# Patient Record
Sex: Female | Born: 1935
Health system: Southern US, Community
[De-identification: ages and names within clinical notes are randomized; demographics above are authoritative.]

## PROBLEM LIST (undated history)

## (undated) DIAGNOSIS — M1990 Unspecified osteoarthritis, unspecified site: Secondary | ICD-10-CM

## (undated) DIAGNOSIS — I10 Essential (primary) hypertension: Secondary | ICD-10-CM

## (undated) HISTORY — DX: Unspecified osteoarthritis, unspecified site: M19.90

## (undated) HISTORY — DX: Essential (primary) hypertension: I10

---

## 2011-10-15 DIAGNOSIS — M7750 Other enthesopathy of unspecified foot: Secondary | ICD-10-CM | POA: Diagnosis not present

## 2011-10-15 DIAGNOSIS — M779 Enthesopathy, unspecified: Secondary | ICD-10-CM | POA: Diagnosis not present

## 2011-10-15 DIAGNOSIS — I739 Peripheral vascular disease, unspecified: Secondary | ICD-10-CM | POA: Diagnosis not present

## 2011-10-15 DIAGNOSIS — B351 Tinea unguium: Secondary | ICD-10-CM | POA: Diagnosis not present

## 2011-10-15 DIAGNOSIS — M79609 Pain in unspecified limb: Secondary | ICD-10-CM | POA: Diagnosis not present

## 2011-10-15 DIAGNOSIS — L84 Corns and callosities: Secondary | ICD-10-CM | POA: Diagnosis not present

## 2011-10-15 DIAGNOSIS — E1159 Type 2 diabetes mellitus with other circulatory complications: Secondary | ICD-10-CM | POA: Diagnosis not present

## 2011-10-25 DIAGNOSIS — M79609 Pain in unspecified limb: Secondary | ICD-10-CM | POA: Diagnosis not present

## 2011-10-25 DIAGNOSIS — I10 Essential (primary) hypertension: Secondary | ICD-10-CM | POA: Diagnosis not present

## 2011-10-25 DIAGNOSIS — M545 Low back pain, unspecified: Secondary | ICD-10-CM | POA: Diagnosis not present

## 2011-10-25 DIAGNOSIS — I6789 Other cerebrovascular disease: Secondary | ICD-10-CM | POA: Diagnosis not present

## 2011-10-25 DIAGNOSIS — R109 Unspecified abdominal pain: Secondary | ICD-10-CM | POA: Diagnosis not present

## 2011-10-25 DIAGNOSIS — IMO0001 Reserved for inherently not codable concepts without codable children: Secondary | ICD-10-CM | POA: Diagnosis not present

## 2011-11-15 DIAGNOSIS — I110 Hypertensive heart disease with heart failure: Secondary | ICD-10-CM | POA: Diagnosis not present

## 2011-11-15 DIAGNOSIS — M159 Polyosteoarthritis, unspecified: Secondary | ICD-10-CM | POA: Diagnosis not present

## 2011-11-15 DIAGNOSIS — I509 Heart failure, unspecified: Secondary | ICD-10-CM | POA: Diagnosis not present

## 2012-02-21 DIAGNOSIS — R42 Dizziness and giddiness: Secondary | ICD-10-CM | POA: Diagnosis not present

## 2012-02-21 DIAGNOSIS — M25519 Pain in unspecified shoulder: Secondary | ICD-10-CM | POA: Diagnosis not present

## 2012-02-21 DIAGNOSIS — I10 Essential (primary) hypertension: Secondary | ICD-10-CM | POA: Diagnosis not present

## 2012-02-21 DIAGNOSIS — M79609 Pain in unspecified limb: Secondary | ICD-10-CM | POA: Diagnosis not present

## 2012-02-21 DIAGNOSIS — G608 Other hereditary and idiopathic neuropathies: Secondary | ICD-10-CM | POA: Diagnosis not present

## 2012-02-21 DIAGNOSIS — B351 Tinea unguium: Secondary | ICD-10-CM | POA: Diagnosis not present

## 2012-02-21 DIAGNOSIS — K219 Gastro-esophageal reflux disease without esophagitis: Secondary | ICD-10-CM | POA: Diagnosis not present

## 2012-02-21 DIAGNOSIS — R109 Unspecified abdominal pain: Secondary | ICD-10-CM | POA: Diagnosis not present

## 2012-02-21 DIAGNOSIS — M7750 Other enthesopathy of unspecified foot: Secondary | ICD-10-CM | POA: Diagnosis not present

## 2012-02-21 DIAGNOSIS — Z13820 Encounter for screening for osteoporosis: Secondary | ICD-10-CM | POA: Diagnosis not present

## 2012-02-21 DIAGNOSIS — E119 Type 2 diabetes mellitus without complications: Secondary | ICD-10-CM | POA: Diagnosis not present

## 2012-04-16 DIAGNOSIS — I110 Hypertensive heart disease with heart failure: Secondary | ICD-10-CM | POA: Diagnosis not present

## 2012-04-16 DIAGNOSIS — M542 Cervicalgia: Secondary | ICD-10-CM | POA: Diagnosis not present

## 2012-04-16 DIAGNOSIS — M546 Pain in thoracic spine: Secondary | ICD-10-CM | POA: Diagnosis not present

## 2012-04-16 DIAGNOSIS — M159 Polyosteoarthritis, unspecified: Secondary | ICD-10-CM | POA: Diagnosis not present

## 2012-04-16 DIAGNOSIS — M545 Low back pain, unspecified: Secondary | ICD-10-CM | POA: Diagnosis not present

## 2012-04-16 DIAGNOSIS — I509 Heart failure, unspecified: Secondary | ICD-10-CM | POA: Diagnosis not present

## 2012-04-16 DIAGNOSIS — M999 Biomechanical lesion, unspecified: Secondary | ICD-10-CM | POA: Diagnosis not present

## 2012-04-16 DIAGNOSIS — M9981 Other biomechanical lesions of cervical region: Secondary | ICD-10-CM | POA: Diagnosis not present

## 2012-04-22 DIAGNOSIS — J4489 Other specified chronic obstructive pulmonary disease: Secondary | ICD-10-CM | POA: Diagnosis not present

## 2012-04-22 DIAGNOSIS — I10 Essential (primary) hypertension: Secondary | ICD-10-CM | POA: Diagnosis not present

## 2012-04-22 DIAGNOSIS — D518 Other vitamin B12 deficiency anemias: Secondary | ICD-10-CM | POA: Diagnosis not present

## 2012-04-22 DIAGNOSIS — M25519 Pain in unspecified shoulder: Secondary | ICD-10-CM | POA: Diagnosis not present

## 2012-04-22 DIAGNOSIS — J449 Chronic obstructive pulmonary disease, unspecified: Secondary | ICD-10-CM | POA: Diagnosis not present

## 2012-04-22 DIAGNOSIS — R109 Unspecified abdominal pain: Secondary | ICD-10-CM | POA: Diagnosis not present

## 2012-04-22 DIAGNOSIS — K219 Gastro-esophageal reflux disease without esophagitis: Secondary | ICD-10-CM | POA: Diagnosis not present

## 2012-04-29 DIAGNOSIS — M546 Pain in thoracic spine: Secondary | ICD-10-CM | POA: Diagnosis not present

## 2012-04-29 DIAGNOSIS — M545 Low back pain, unspecified: Secondary | ICD-10-CM | POA: Diagnosis not present

## 2012-04-29 DIAGNOSIS — M542 Cervicalgia: Secondary | ICD-10-CM | POA: Diagnosis not present

## 2012-04-29 DIAGNOSIS — M999 Biomechanical lesion, unspecified: Secondary | ICD-10-CM | POA: Diagnosis not present

## 2012-04-29 DIAGNOSIS — M9981 Other biomechanical lesions of cervical region: Secondary | ICD-10-CM | POA: Diagnosis not present

## 2012-05-08 DIAGNOSIS — M999 Biomechanical lesion, unspecified: Secondary | ICD-10-CM | POA: Diagnosis not present

## 2012-05-08 DIAGNOSIS — M9981 Other biomechanical lesions of cervical region: Secondary | ICD-10-CM | POA: Diagnosis not present

## 2012-05-08 DIAGNOSIS — M546 Pain in thoracic spine: Secondary | ICD-10-CM | POA: Diagnosis not present

## 2012-05-08 DIAGNOSIS — M545 Low back pain, unspecified: Secondary | ICD-10-CM | POA: Diagnosis not present

## 2012-05-08 DIAGNOSIS — M542 Cervicalgia: Secondary | ICD-10-CM | POA: Diagnosis not present

## 2012-05-20 DIAGNOSIS — M545 Low back pain, unspecified: Secondary | ICD-10-CM | POA: Diagnosis not present

## 2012-05-20 DIAGNOSIS — M542 Cervicalgia: Secondary | ICD-10-CM | POA: Diagnosis not present

## 2012-05-20 DIAGNOSIS — M9981 Other biomechanical lesions of cervical region: Secondary | ICD-10-CM | POA: Diagnosis not present

## 2012-05-20 DIAGNOSIS — M999 Biomechanical lesion, unspecified: Secondary | ICD-10-CM | POA: Diagnosis not present

## 2012-05-20 DIAGNOSIS — M546 Pain in thoracic spine: Secondary | ICD-10-CM | POA: Diagnosis not present

## 2012-05-22 DIAGNOSIS — M546 Pain in thoracic spine: Secondary | ICD-10-CM | POA: Diagnosis not present

## 2012-05-22 DIAGNOSIS — M999 Biomechanical lesion, unspecified: Secondary | ICD-10-CM | POA: Diagnosis not present

## 2012-05-22 DIAGNOSIS — M542 Cervicalgia: Secondary | ICD-10-CM | POA: Diagnosis not present

## 2012-05-22 DIAGNOSIS — M9981 Other biomechanical lesions of cervical region: Secondary | ICD-10-CM | POA: Diagnosis not present

## 2012-05-22 DIAGNOSIS — M545 Low back pain, unspecified: Secondary | ICD-10-CM | POA: Diagnosis not present

## 2012-06-05 DIAGNOSIS — L84 Corns and callosities: Secondary | ICD-10-CM | POA: Diagnosis not present

## 2012-06-05 DIAGNOSIS — M79609 Pain in unspecified limb: Secondary | ICD-10-CM | POA: Diagnosis not present

## 2012-06-05 DIAGNOSIS — M9981 Other biomechanical lesions of cervical region: Secondary | ICD-10-CM | POA: Diagnosis not present

## 2012-06-05 DIAGNOSIS — M999 Biomechanical lesion, unspecified: Secondary | ICD-10-CM | POA: Diagnosis not present

## 2012-06-05 DIAGNOSIS — B351 Tinea unguium: Secondary | ICD-10-CM | POA: Diagnosis not present

## 2012-06-05 DIAGNOSIS — M542 Cervicalgia: Secondary | ICD-10-CM | POA: Diagnosis not present

## 2012-06-05 DIAGNOSIS — M546 Pain in thoracic spine: Secondary | ICD-10-CM | POA: Diagnosis not present

## 2012-06-05 DIAGNOSIS — L03039 Cellulitis of unspecified toe: Secondary | ICD-10-CM | POA: Diagnosis not present

## 2012-06-05 DIAGNOSIS — M545 Low back pain, unspecified: Secondary | ICD-10-CM | POA: Diagnosis not present

## 2012-06-05 DIAGNOSIS — I7389 Other specified peripheral vascular diseases: Secondary | ICD-10-CM | POA: Diagnosis not present

## 2012-06-05 DIAGNOSIS — L600 Ingrowing nail: Secondary | ICD-10-CM | POA: Diagnosis not present

## 2012-06-18 DIAGNOSIS — M545 Low back pain, unspecified: Secondary | ICD-10-CM | POA: Diagnosis not present

## 2012-06-18 DIAGNOSIS — M999 Biomechanical lesion, unspecified: Secondary | ICD-10-CM | POA: Diagnosis not present

## 2012-06-18 DIAGNOSIS — M542 Cervicalgia: Secondary | ICD-10-CM | POA: Diagnosis not present

## 2012-06-18 DIAGNOSIS — M546 Pain in thoracic spine: Secondary | ICD-10-CM | POA: Diagnosis not present

## 2012-06-18 DIAGNOSIS — M9981 Other biomechanical lesions of cervical region: Secondary | ICD-10-CM | POA: Diagnosis not present

## 2012-06-24 DIAGNOSIS — M545 Low back pain, unspecified: Secondary | ICD-10-CM | POA: Diagnosis not present

## 2012-06-24 DIAGNOSIS — D518 Other vitamin B12 deficiency anemias: Secondary | ICD-10-CM | POA: Diagnosis not present

## 2012-06-24 DIAGNOSIS — R109 Unspecified abdominal pain: Secondary | ICD-10-CM | POA: Diagnosis not present

## 2012-06-24 DIAGNOSIS — G608 Other hereditary and idiopathic neuropathies: Secondary | ICD-10-CM | POA: Diagnosis not present

## 2012-07-02 DIAGNOSIS — M546 Pain in thoracic spine: Secondary | ICD-10-CM | POA: Diagnosis not present

## 2012-07-02 DIAGNOSIS — M999 Biomechanical lesion, unspecified: Secondary | ICD-10-CM | POA: Diagnosis not present

## 2012-07-02 DIAGNOSIS — M545 Low back pain, unspecified: Secondary | ICD-10-CM | POA: Diagnosis not present

## 2012-07-02 DIAGNOSIS — M542 Cervicalgia: Secondary | ICD-10-CM | POA: Diagnosis not present

## 2012-07-02 DIAGNOSIS — M9981 Other biomechanical lesions of cervical region: Secondary | ICD-10-CM | POA: Diagnosis not present

## 2012-07-14 DIAGNOSIS — E785 Hyperlipidemia, unspecified: Secondary | ICD-10-CM | POA: Diagnosis not present

## 2012-07-14 DIAGNOSIS — I110 Hypertensive heart disease with heart failure: Secondary | ICD-10-CM | POA: Diagnosis not present

## 2012-07-14 DIAGNOSIS — I509 Heart failure, unspecified: Secondary | ICD-10-CM | POA: Diagnosis not present

## 2012-07-16 DIAGNOSIS — M545 Low back pain, unspecified: Secondary | ICD-10-CM | POA: Diagnosis not present

## 2012-07-16 DIAGNOSIS — M9981 Other biomechanical lesions of cervical region: Secondary | ICD-10-CM | POA: Diagnosis not present

## 2012-07-16 DIAGNOSIS — M546 Pain in thoracic spine: Secondary | ICD-10-CM | POA: Diagnosis not present

## 2012-07-16 DIAGNOSIS — M79609 Pain in unspecified limb: Secondary | ICD-10-CM | POA: Diagnosis not present

## 2012-07-16 DIAGNOSIS — M542 Cervicalgia: Secondary | ICD-10-CM | POA: Diagnosis not present

## 2012-07-16 DIAGNOSIS — I739 Peripheral vascular disease, unspecified: Secondary | ICD-10-CM | POA: Diagnosis not present

## 2012-07-16 DIAGNOSIS — M999 Biomechanical lesion, unspecified: Secondary | ICD-10-CM | POA: Diagnosis not present

## 2012-07-16 DIAGNOSIS — E1159 Type 2 diabetes mellitus with other circulatory complications: Secondary | ICD-10-CM | POA: Diagnosis not present

## 2012-07-23 DIAGNOSIS — M542 Cervicalgia: Secondary | ICD-10-CM | POA: Diagnosis not present

## 2012-07-23 DIAGNOSIS — M9981 Other biomechanical lesions of cervical region: Secondary | ICD-10-CM | POA: Diagnosis not present

## 2012-07-23 DIAGNOSIS — M545 Low back pain, unspecified: Secondary | ICD-10-CM | POA: Diagnosis not present

## 2012-07-23 DIAGNOSIS — M546 Pain in thoracic spine: Secondary | ICD-10-CM | POA: Diagnosis not present

## 2012-07-23 DIAGNOSIS — M999 Biomechanical lesion, unspecified: Secondary | ICD-10-CM | POA: Diagnosis not present

## 2012-07-29 DIAGNOSIS — M546 Pain in thoracic spine: Secondary | ICD-10-CM | POA: Diagnosis not present

## 2012-07-29 DIAGNOSIS — M542 Cervicalgia: Secondary | ICD-10-CM | POA: Diagnosis not present

## 2012-07-29 DIAGNOSIS — M545 Low back pain, unspecified: Secondary | ICD-10-CM | POA: Diagnosis not present

## 2012-07-29 DIAGNOSIS — M9981 Other biomechanical lesions of cervical region: Secondary | ICD-10-CM | POA: Diagnosis not present

## 2012-07-29 DIAGNOSIS — M999 Biomechanical lesion, unspecified: Secondary | ICD-10-CM | POA: Diagnosis not present

## 2012-07-31 DIAGNOSIS — M545 Low back pain, unspecified: Secondary | ICD-10-CM | POA: Diagnosis not present

## 2012-07-31 DIAGNOSIS — M9981 Other biomechanical lesions of cervical region: Secondary | ICD-10-CM | POA: Diagnosis not present

## 2012-07-31 DIAGNOSIS — M999 Biomechanical lesion, unspecified: Secondary | ICD-10-CM | POA: Diagnosis not present

## 2012-07-31 DIAGNOSIS — M546 Pain in thoracic spine: Secondary | ICD-10-CM | POA: Diagnosis not present

## 2012-07-31 DIAGNOSIS — M542 Cervicalgia: Secondary | ICD-10-CM | POA: Diagnosis not present

## 2012-08-19 DIAGNOSIS — M999 Biomechanical lesion, unspecified: Secondary | ICD-10-CM | POA: Diagnosis not present

## 2012-08-19 DIAGNOSIS — M542 Cervicalgia: Secondary | ICD-10-CM | POA: Diagnosis not present

## 2012-08-19 DIAGNOSIS — M546 Pain in thoracic spine: Secondary | ICD-10-CM | POA: Diagnosis not present

## 2012-08-19 DIAGNOSIS — M9981 Other biomechanical lesions of cervical region: Secondary | ICD-10-CM | POA: Diagnosis not present

## 2012-08-19 DIAGNOSIS — M545 Low back pain, unspecified: Secondary | ICD-10-CM | POA: Diagnosis not present

## 2012-09-01 DIAGNOSIS — R109 Unspecified abdominal pain: Secondary | ICD-10-CM | POA: Diagnosis not present

## 2012-09-01 DIAGNOSIS — K219 Gastro-esophageal reflux disease without esophagitis: Secondary | ICD-10-CM | POA: Diagnosis not present

## 2012-09-01 DIAGNOSIS — M159 Polyosteoarthritis, unspecified: Secondary | ICD-10-CM | POA: Diagnosis not present

## 2012-09-01 DIAGNOSIS — R05 Cough: Secondary | ICD-10-CM | POA: Diagnosis not present

## 2012-09-01 DIAGNOSIS — J4489 Other specified chronic obstructive pulmonary disease: Secondary | ICD-10-CM | POA: Diagnosis not present

## 2012-09-01 DIAGNOSIS — J449 Chronic obstructive pulmonary disease, unspecified: Secondary | ICD-10-CM | POA: Diagnosis not present

## 2012-09-01 DIAGNOSIS — R059 Cough, unspecified: Secondary | ICD-10-CM | POA: Diagnosis not present

## 2012-09-01 DIAGNOSIS — M542 Cervicalgia: Secondary | ICD-10-CM | POA: Diagnosis not present

## 2012-09-07 DIAGNOSIS — E1159 Type 2 diabetes mellitus with other circulatory complications: Secondary | ICD-10-CM | POA: Diagnosis not present

## 2012-09-07 DIAGNOSIS — L600 Ingrowing nail: Secondary | ICD-10-CM | POA: Diagnosis not present

## 2012-09-07 DIAGNOSIS — M2040 Other hammer toe(s) (acquired), unspecified foot: Secondary | ICD-10-CM | POA: Diagnosis not present

## 2012-09-07 DIAGNOSIS — B351 Tinea unguium: Secondary | ICD-10-CM | POA: Diagnosis not present

## 2012-09-07 DIAGNOSIS — I7389 Other specified peripheral vascular diseases: Secondary | ICD-10-CM | POA: Diagnosis not present

## 2012-09-07 DIAGNOSIS — M79609 Pain in unspecified limb: Secondary | ICD-10-CM | POA: Diagnosis not present

## 2012-09-07 DIAGNOSIS — L84 Corns and callosities: Secondary | ICD-10-CM | POA: Diagnosis not present

## 2012-09-18 DIAGNOSIS — M542 Cervicalgia: Secondary | ICD-10-CM | POA: Diagnosis not present

## 2012-09-18 DIAGNOSIS — R51 Headache: Secondary | ICD-10-CM | POA: Diagnosis not present

## 2012-09-18 DIAGNOSIS — M9981 Other biomechanical lesions of cervical region: Secondary | ICD-10-CM | POA: Diagnosis not present

## 2012-09-18 DIAGNOSIS — M546 Pain in thoracic spine: Secondary | ICD-10-CM | POA: Diagnosis not present

## 2012-09-18 DIAGNOSIS — M999 Biomechanical lesion, unspecified: Secondary | ICD-10-CM | POA: Diagnosis not present

## 2012-09-25 DIAGNOSIS — M9981 Other biomechanical lesions of cervical region: Secondary | ICD-10-CM | POA: Diagnosis not present

## 2012-09-25 DIAGNOSIS — M999 Biomechanical lesion, unspecified: Secondary | ICD-10-CM | POA: Diagnosis not present

## 2012-09-25 DIAGNOSIS — M546 Pain in thoracic spine: Secondary | ICD-10-CM | POA: Diagnosis not present

## 2012-09-25 DIAGNOSIS — R51 Headache: Secondary | ICD-10-CM | POA: Diagnosis not present

## 2012-09-25 DIAGNOSIS — M542 Cervicalgia: Secondary | ICD-10-CM | POA: Diagnosis not present

## 2012-10-02 DIAGNOSIS — M542 Cervicalgia: Secondary | ICD-10-CM | POA: Diagnosis not present

## 2012-10-02 DIAGNOSIS — M546 Pain in thoracic spine: Secondary | ICD-10-CM | POA: Diagnosis not present

## 2012-10-02 DIAGNOSIS — M999 Biomechanical lesion, unspecified: Secondary | ICD-10-CM | POA: Diagnosis not present

## 2012-10-02 DIAGNOSIS — R51 Headache: Secondary | ICD-10-CM | POA: Diagnosis not present

## 2012-10-02 DIAGNOSIS — M9981 Other biomechanical lesions of cervical region: Secondary | ICD-10-CM | POA: Diagnosis not present

## 2012-10-18 DIAGNOSIS — Z87442 Personal history of urinary calculi: Secondary | ICD-10-CM | POA: Diagnosis not present

## 2012-10-18 DIAGNOSIS — A09 Infectious gastroenteritis and colitis, unspecified: Secondary | ICD-10-CM | POA: Diagnosis not present

## 2012-10-18 DIAGNOSIS — I10 Essential (primary) hypertension: Secondary | ICD-10-CM | POA: Diagnosis not present

## 2012-10-18 DIAGNOSIS — R197 Diarrhea, unspecified: Secondary | ICD-10-CM | POA: Diagnosis not present

## 2012-10-18 DIAGNOSIS — F411 Generalized anxiety disorder: Secondary | ICD-10-CM | POA: Diagnosis not present

## 2012-10-18 DIAGNOSIS — R5381 Other malaise: Secondary | ICD-10-CM | POA: Diagnosis not present

## 2012-10-18 DIAGNOSIS — Z01818 Encounter for other preprocedural examination: Secondary | ICD-10-CM | POA: Diagnosis not present

## 2012-10-18 DIAGNOSIS — E860 Dehydration: Secondary | ICD-10-CM | POA: Diagnosis not present

## 2012-10-18 DIAGNOSIS — D649 Anemia, unspecified: Secondary | ICD-10-CM | POA: Diagnosis not present

## 2012-10-18 DIAGNOSIS — R112 Nausea with vomiting, unspecified: Secondary | ICD-10-CM | POA: Diagnosis not present

## 2012-10-19 DIAGNOSIS — A09 Infectious gastroenteritis and colitis, unspecified: Secondary | ICD-10-CM | POA: Diagnosis not present

## 2012-10-19 DIAGNOSIS — R197 Diarrhea, unspecified: Secondary | ICD-10-CM | POA: Diagnosis not present

## 2012-10-19 DIAGNOSIS — F411 Generalized anxiety disorder: Secondary | ICD-10-CM | POA: Diagnosis not present

## 2012-10-19 DIAGNOSIS — E860 Dehydration: Secondary | ICD-10-CM | POA: Diagnosis not present

## 2012-10-19 DIAGNOSIS — Z01818 Encounter for other preprocedural examination: Secondary | ICD-10-CM | POA: Diagnosis not present

## 2012-10-19 DIAGNOSIS — Z87442 Personal history of urinary calculi: Secondary | ICD-10-CM | POA: Diagnosis not present

## 2012-10-19 DIAGNOSIS — D649 Anemia, unspecified: Secondary | ICD-10-CM | POA: Diagnosis not present

## 2012-10-19 DIAGNOSIS — I10 Essential (primary) hypertension: Secondary | ICD-10-CM | POA: Diagnosis not present

## 2012-10-20 DIAGNOSIS — I10 Essential (primary) hypertension: Secondary | ICD-10-CM | POA: Diagnosis not present

## 2012-10-20 DIAGNOSIS — Z01818 Encounter for other preprocedural examination: Secondary | ICD-10-CM | POA: Diagnosis not present

## 2012-10-20 DIAGNOSIS — E860 Dehydration: Secondary | ICD-10-CM | POA: Diagnosis not present

## 2012-10-20 DIAGNOSIS — R197 Diarrhea, unspecified: Secondary | ICD-10-CM | POA: Diagnosis not present

## 2012-10-20 DIAGNOSIS — F411 Generalized anxiety disorder: Secondary | ICD-10-CM | POA: Diagnosis not present

## 2012-10-20 DIAGNOSIS — Z87442 Personal history of urinary calculi: Secondary | ICD-10-CM | POA: Diagnosis not present

## 2012-10-20 DIAGNOSIS — D649 Anemia, unspecified: Secondary | ICD-10-CM | POA: Diagnosis not present

## 2012-10-20 DIAGNOSIS — A09 Infectious gastroenteritis and colitis, unspecified: Secondary | ICD-10-CM | POA: Diagnosis not present

## 2012-10-21 DIAGNOSIS — E860 Dehydration: Secondary | ICD-10-CM | POA: Diagnosis not present

## 2012-10-21 DIAGNOSIS — I10 Essential (primary) hypertension: Secondary | ICD-10-CM | POA: Diagnosis not present

## 2012-10-21 DIAGNOSIS — R197 Diarrhea, unspecified: Secondary | ICD-10-CM | POA: Diagnosis not present

## 2012-10-21 DIAGNOSIS — A09 Infectious gastroenteritis and colitis, unspecified: Secondary | ICD-10-CM | POA: Diagnosis not present

## 2012-10-21 DIAGNOSIS — Z87442 Personal history of urinary calculi: Secondary | ICD-10-CM | POA: Diagnosis not present

## 2012-10-21 DIAGNOSIS — Z01818 Encounter for other preprocedural examination: Secondary | ICD-10-CM | POA: Diagnosis not present

## 2012-10-21 DIAGNOSIS — D649 Anemia, unspecified: Secondary | ICD-10-CM | POA: Diagnosis not present

## 2012-10-21 DIAGNOSIS — F411 Generalized anxiety disorder: Secondary | ICD-10-CM | POA: Diagnosis not present

## 2012-10-23 DIAGNOSIS — M546 Pain in thoracic spine: Secondary | ICD-10-CM | POA: Diagnosis not present

## 2012-10-23 DIAGNOSIS — R51 Headache: Secondary | ICD-10-CM | POA: Diagnosis not present

## 2012-10-23 DIAGNOSIS — M999 Biomechanical lesion, unspecified: Secondary | ICD-10-CM | POA: Diagnosis not present

## 2012-10-23 DIAGNOSIS — M542 Cervicalgia: Secondary | ICD-10-CM | POA: Diagnosis not present

## 2012-10-23 DIAGNOSIS — M9981 Other biomechanical lesions of cervical region: Secondary | ICD-10-CM | POA: Diagnosis not present

## 2012-10-30 DIAGNOSIS — M999 Biomechanical lesion, unspecified: Secondary | ICD-10-CM | POA: Diagnosis not present

## 2012-10-30 DIAGNOSIS — R51 Headache: Secondary | ICD-10-CM | POA: Diagnosis not present

## 2012-10-30 DIAGNOSIS — M9981 Other biomechanical lesions of cervical region: Secondary | ICD-10-CM | POA: Diagnosis not present

## 2012-10-30 DIAGNOSIS — M546 Pain in thoracic spine: Secondary | ICD-10-CM | POA: Diagnosis not present

## 2012-10-30 DIAGNOSIS — M542 Cervicalgia: Secondary | ICD-10-CM | POA: Diagnosis not present

## 2012-11-02 DIAGNOSIS — I110 Hypertensive heart disease with heart failure: Secondary | ICD-10-CM | POA: Diagnosis not present

## 2012-11-02 DIAGNOSIS — M159 Polyosteoarthritis, unspecified: Secondary | ICD-10-CM | POA: Diagnosis not present

## 2012-12-02 DIAGNOSIS — I10 Essential (primary) hypertension: Secondary | ICD-10-CM | POA: Diagnosis not present

## 2012-12-02 DIAGNOSIS — M159 Polyosteoarthritis, unspecified: Secondary | ICD-10-CM | POA: Diagnosis not present

## 2012-12-02 DIAGNOSIS — M545 Low back pain, unspecified: Secondary | ICD-10-CM | POA: Diagnosis not present

## 2012-12-02 DIAGNOSIS — M79609 Pain in unspecified limb: Secondary | ICD-10-CM | POA: Diagnosis not present

## 2012-12-02 DIAGNOSIS — L600 Ingrowing nail: Secondary | ICD-10-CM | POA: Diagnosis not present

## 2012-12-02 DIAGNOSIS — M7750 Other enthesopathy of unspecified foot: Secondary | ICD-10-CM | POA: Diagnosis not present

## 2012-12-02 DIAGNOSIS — K219 Gastro-esophageal reflux disease without esophagitis: Secondary | ICD-10-CM | POA: Diagnosis not present

## 2012-12-02 DIAGNOSIS — E119 Type 2 diabetes mellitus without complications: Secondary | ICD-10-CM | POA: Diagnosis not present

## 2012-12-02 DIAGNOSIS — N318 Other neuromuscular dysfunction of bladder: Secondary | ICD-10-CM | POA: Diagnosis not present

## 2012-12-02 DIAGNOSIS — R109 Unspecified abdominal pain: Secondary | ICD-10-CM | POA: Diagnosis not present

## 2012-12-02 DIAGNOSIS — E1159 Type 2 diabetes mellitus with other circulatory complications: Secondary | ICD-10-CM | POA: Diagnosis not present

## 2012-12-02 DIAGNOSIS — M25579 Pain in unspecified ankle and joints of unspecified foot: Secondary | ICD-10-CM | POA: Diagnosis not present

## 2012-12-02 DIAGNOSIS — B351 Tinea unguium: Secondary | ICD-10-CM | POA: Diagnosis not present

## 2012-12-02 DIAGNOSIS — L84 Corns and callosities: Secondary | ICD-10-CM | POA: Diagnosis not present

## 2013-02-20 DIAGNOSIS — R059 Cough, unspecified: Secondary | ICD-10-CM | POA: Diagnosis not present

## 2013-02-20 DIAGNOSIS — R05 Cough: Secondary | ICD-10-CM | POA: Diagnosis not present

## 2013-02-20 DIAGNOSIS — J40 Bronchitis, not specified as acute or chronic: Secondary | ICD-10-CM | POA: Diagnosis not present

## 2013-02-20 DIAGNOSIS — R0602 Shortness of breath: Secondary | ICD-10-CM | POA: Diagnosis not present

## 2013-02-20 DIAGNOSIS — R918 Other nonspecific abnormal finding of lung field: Secondary | ICD-10-CM | POA: Diagnosis not present

## 2013-02-20 DIAGNOSIS — G510 Bell's palsy: Secondary | ICD-10-CM | POA: Diagnosis not present

## 2013-02-20 DIAGNOSIS — I10 Essential (primary) hypertension: Secondary | ICD-10-CM | POA: Diagnosis not present

## 2013-02-20 DIAGNOSIS — J984 Other disorders of lung: Secondary | ICD-10-CM | POA: Diagnosis not present

## 2013-02-20 DIAGNOSIS — R9389 Abnormal findings on diagnostic imaging of other specified body structures: Secondary | ICD-10-CM | POA: Diagnosis not present

## 2013-05-12 DIAGNOSIS — J449 Chronic obstructive pulmonary disease, unspecified: Secondary | ICD-10-CM | POA: Diagnosis not present

## 2013-05-12 DIAGNOSIS — IMO0001 Reserved for inherently not codable concepts without codable children: Secondary | ICD-10-CM | POA: Diagnosis not present

## 2013-05-12 DIAGNOSIS — I7389 Other specified peripheral vascular diseases: Secondary | ICD-10-CM | POA: Diagnosis not present

## 2013-05-12 DIAGNOSIS — I10 Essential (primary) hypertension: Secondary | ICD-10-CM | POA: Diagnosis not present

## 2013-05-12 DIAGNOSIS — M159 Polyosteoarthritis, unspecified: Secondary | ICD-10-CM | POA: Diagnosis not present

## 2013-05-12 DIAGNOSIS — M79609 Pain in unspecified limb: Secondary | ICD-10-CM | POA: Diagnosis not present

## 2013-05-12 DIAGNOSIS — R42 Dizziness and giddiness: Secondary | ICD-10-CM | POA: Diagnosis not present

## 2013-09-15 DIAGNOSIS — J189 Pneumonia, unspecified organism: Secondary | ICD-10-CM | POA: Diagnosis not present

## 2013-09-15 DIAGNOSIS — I110 Hypertensive heart disease with heart failure: Secondary | ICD-10-CM | POA: Diagnosis not present

## 2013-10-15 DIAGNOSIS — M545 Low back pain, unspecified: Secondary | ICD-10-CM | POA: Diagnosis not present

## 2013-10-15 DIAGNOSIS — M79609 Pain in unspecified limb: Secondary | ICD-10-CM | POA: Diagnosis not present

## 2013-10-15 DIAGNOSIS — N182 Chronic kidney disease, stage 2 (mild): Secondary | ICD-10-CM | POA: Diagnosis not present

## 2013-10-15 DIAGNOSIS — M2040 Other hammer toe(s) (acquired), unspecified foot: Secondary | ICD-10-CM | POA: Diagnosis not present

## 2013-10-15 DIAGNOSIS — B351 Tinea unguium: Secondary | ICD-10-CM | POA: Diagnosis not present

## 2013-10-15 DIAGNOSIS — IMO0001 Reserved for inherently not codable concepts without codable children: Secondary | ICD-10-CM | POA: Diagnosis not present

## 2013-10-15 DIAGNOSIS — I10 Essential (primary) hypertension: Secondary | ICD-10-CM | POA: Diagnosis not present

## 2013-10-15 DIAGNOSIS — E1159 Type 2 diabetes mellitus with other circulatory complications: Secondary | ICD-10-CM | POA: Diagnosis not present

## 2013-10-15 DIAGNOSIS — M159 Polyosteoarthritis, unspecified: Secondary | ICD-10-CM | POA: Diagnosis not present

## 2013-10-15 DIAGNOSIS — L84 Corns and callosities: Secondary | ICD-10-CM | POA: Diagnosis not present

## 2013-10-15 DIAGNOSIS — I7389 Other specified peripheral vascular diseases: Secondary | ICD-10-CM | POA: Diagnosis not present

## 2013-11-04 DIAGNOSIS — IMO0001 Reserved for inherently not codable concepts without codable children: Secondary | ICD-10-CM | POA: Diagnosis not present

## 2013-11-04 DIAGNOSIS — M545 Low back pain, unspecified: Secondary | ICD-10-CM | POA: Diagnosis not present

## 2013-11-04 DIAGNOSIS — D649 Anemia, unspecified: Secondary | ICD-10-CM | POA: Diagnosis not present

## 2013-11-04 DIAGNOSIS — E559 Vitamin D deficiency, unspecified: Secondary | ICD-10-CM | POA: Diagnosis not present

## 2014-01-12 DIAGNOSIS — M159 Polyosteoarthritis, unspecified: Secondary | ICD-10-CM | POA: Diagnosis not present

## 2014-01-12 DIAGNOSIS — I10 Essential (primary) hypertension: Secondary | ICD-10-CM | POA: Diagnosis not present

## 2014-05-06 DIAGNOSIS — M159 Polyosteoarthritis, unspecified: Secondary | ICD-10-CM | POA: Diagnosis not present

## 2014-05-06 DIAGNOSIS — I10 Essential (primary) hypertension: Secondary | ICD-10-CM | POA: Diagnosis not present

## 2014-07-29 DIAGNOSIS — Z23 Encounter for immunization: Secondary | ICD-10-CM | POA: Diagnosis not present

## 2014-08-24 ENCOUNTER — Ambulatory Visit (INDEPENDENT_AMBULATORY_CARE_PROVIDER_SITE_OTHER): Payer: Medicare Other | Admitting: Emergency Medicine

## 2014-08-24 ENCOUNTER — Ambulatory Visit (INDEPENDENT_AMBULATORY_CARE_PROVIDER_SITE_OTHER): Payer: Medicare Other

## 2014-08-24 VITALS — BP 140/82 | HR 94 | Temp 98.1°F | Resp 18 | Ht <= 58 in | Wt 121.0 lb

## 2014-08-24 DIAGNOSIS — J209 Acute bronchitis, unspecified: Secondary | ICD-10-CM

## 2014-08-24 DIAGNOSIS — R05 Cough: Secondary | ICD-10-CM

## 2014-08-24 DIAGNOSIS — J0140 Acute pansinusitis, unspecified: Secondary | ICD-10-CM | POA: Diagnosis not present

## 2014-08-24 DIAGNOSIS — R059 Cough, unspecified: Secondary | ICD-10-CM

## 2014-08-24 MED ORDER — HYDROCOD POLST-CHLORPHEN POLST 10-8 MG/5ML PO LQCR: 5.0000 mL | Freq: Two times a day (BID) | ORAL | Status: AC | PRN

## 2014-08-24 MED ORDER — AMOXICILLIN-POT CLAVULANATE 875-125 MG PO TABS: 1.0000 | ORAL_TABLET | Freq: Two times a day (BID) | ORAL | Status: AC

## 2014-08-24 NOTE — Progress Notes (Signed)
[facility] [address], [place] [id number] [phone]  Date:  08/24/2014   Name:  Malissa Hippoensri Ripoll   DOB:  1932/06/23   MRN:  [id number]  PCP:  No primary care provider on file.    Chief Complaint: Cough and Sore Throat   History of Present Illness:  Ashley Blake is a 80 y.o. very pleasant female patient who presents with the following:  [place] and since arriving has experienced a watery nasal discharge and a post nasal drip. She has a cough for the past two weeks productive of mucopurulent sputum Some wheezing and exertional shortness of breath.  History of asthma Night time fever.  No chills Sore throat No nausea or vomiting No improvement with over the counter medications or other home remedies.  Denies other complaint or health concern today.   There are no active problems to display for this patient.   Past Medical History  Diagnosis Date  . Arthritis   . Hypertension     No past surgical history on file.  History  Substance Use Topics  . Smoking status: Never Smoker   . Smokeless tobacco: Not on file  . Alcohol Use: Not on file    No family history on file.  No Known Allergies  Medication list has been reviewed and updated.  No current outpatient prescriptions on file prior to visit.   No current facility-administered medications on file prior to visit.    Review of Systems:  As per HPI, otherwise negative.    Physical Examination: Filed Vitals:   08/24/14 1459  BP: 140/82  Pulse: 94  Temp: 98.1 F (36.7 C)  Resp: 18   Filed Vitals:   08/24/14 1459  Height: 4\' 10"  (1.473 m)  Weight: 121 lb (54.885 kg)   Body mass index is 25.3 kg/(m^2). Ideal Body Weight: Weight in (lb) to have BMI = 25: 119.4  GEN: WDWN, NAD, Non-toxic, A & O x 3 HEENT: Atraumatic, Normocephalic. Neck supple. No masses, No LAD. Ears and Nose: No external deformity. CV: RRR, No M/G/R. No JVD. No thrill. No  extra heart sounds. PULM: CTA B, no wheezes, crackles, rhonchi. No retractions. No resp. distress. No accessory muscle use. ABD: S, NT, ND, +BS. No rebound. No HSM. EXTR: No c/c/e NEURO Normal gait.  PSYCH: Normally interactive. Conversant. Not depressed or anxious appearing.  Calm demeanor.    Assessment and Plan: Sinusitis Bronchitis augmentin tussionex  Signed,  Phillips OdorJeffery Derwin Reddy, MD   [facility] reading (PRIMARY) by  Dr. Dareen PianoAnderson.  negative.

## 2014-08-24 NOTE — Patient Instructions (Signed)

## 2014-09-10 ENCOUNTER — Ambulatory Visit (INDEPENDENT_AMBULATORY_CARE_PROVIDER_SITE_OTHER): Payer: Medicare Other | Admitting: Family Medicine

## 2014-09-10 ENCOUNTER — Other Ambulatory Visit: Payer: Self-pay | Admitting: Family Medicine

## 2014-09-10 ENCOUNTER — Ambulatory Visit (INDEPENDENT_AMBULATORY_CARE_PROVIDER_SITE_OTHER): Payer: Medicare Other

## 2014-09-10 VITALS — BP 152/84 | HR 72 | Temp 97.9°F | Resp 16 | Ht <= 58 in | Wt 123.0 lb

## 2014-09-10 DIAGNOSIS — M25561 Pain in right knee: Secondary | ICD-10-CM

## 2014-09-10 DIAGNOSIS — R05 Cough: Secondary | ICD-10-CM

## 2014-09-10 DIAGNOSIS — R059 Cough, unspecified: Secondary | ICD-10-CM

## 2014-09-10 DIAGNOSIS — M25511 Pain in right shoulder: Secondary | ICD-10-CM

## 2014-09-10 DIAGNOSIS — M25551 Pain in right hip: Secondary | ICD-10-CM

## 2014-09-10 MED ORDER — GUAIFENESIN ER 600 MG PO TB12: 600.0000 mg | ORAL_TABLET | Freq: Two times a day (BID) | ORAL | Status: AC | PRN

## 2014-09-10 MED ORDER — AZITHROMYCIN 250 MG PO TABS: ORAL_TABLET | ORAL | Status: AC

## 2014-09-10 MED ORDER — ACETAMINOPHEN 500 MG PO TABS: 500.0000 mg | ORAL_TABLET | Freq: Four times a day (QID) | ORAL | Status: AC | PRN

## 2014-09-10 NOTE — Progress Notes (Signed)
Subjective:    Patient ID: Ashley Blake, female    DOB: 21-Mar-1932, 81 y.o.   MRN: [id number] This chart was scribed for Ashley Staggers, MD by Jolene Provost, Medical Scribe. This patient was seen in Room 1 and the patient's care was started a 10:47 AM.  HPI  Pt does not speak english. Pt's son translated.  HPI Comments: Ashley Blake is a 81 y.o. female with a hx of CVA three years ago who presents to [facility] complaining of right shoulder, and right leg pain that goes from her hip to her knee that stared one week ago. Pt states the pain is worse with walking.   Pt also endorses numbness and in her right hand when she sleeps. Pt denies weakness.  Pt also endorses persistent cough with sticky white drainage that started two weeks ago. Pt denies sick contacts. Pt has taken tylenol and nyquil with limited relief of sx. Pt was seen by Dr. Dareen Piano two weeks ago, and treated with 10 days of Augmentin and tussonex as needed for bronchitis and sinusitis.     There are no active problems to display for this patient.  Past Medical History  Diagnosis Date  . Arthritis   . Hypertension    No past surgical history on file. No Known Allergies Prior to Admission medications   Medication Sig Start Date End Date Taking? Authorizing Provider  amoxicillin-clavulanate (AUGMENTIN) 875-125 MG per tablet Take 1 tablet by mouth 2 (two) times daily. 08/24/14  Yes Carmelina Dane, MD  benazepril (LOTENSIN) 20 MG tablet Take 20 mg by mouth daily.   Yes Historical Provider, MD  carvedilol (COREG) 25 MG tablet Take 25 mg by mouth 2 (two) times daily with a meal.   Yes Historical Provider, MD  chlorpheniramine-HYDROcodone (TUSSIONEX PENNKINETIC ER) 10-8 MG/5ML LQCR Take 5 mLs by mouth every 12 (twelve) hours as needed. 08/24/14  Yes Carmelina Dane, MD  rosuvastatin (CRESTOR) 10 MG tablet Take 10 mg by mouth daily.   Yes Historical Provider, MD   History   Social History  . Marital Status: Widowed    Spouse Name: N/A    Number of Children: N/A  . Years of Education: N/A   Occupational History  . Not on file.   Social History Main Topics  . Smoking status: Never Smoker   . Smokeless tobacco: Not on file  . Alcohol Use: Not on file  . Drug Use: Not on file  . Sexual Activity: Not on file   Other Topics Concern  . Not on file   Social History Narrative    Review of Systems  Constitutional: Negative for fever and chills.  Respiratory: Positive for cough.   Musculoskeletal: Positive for arthralgias.  Neurological: Negative for weakness.       Pain and stiffness on right side, worst in shoulder.        Objective:   Physical Exam  Constitutional: She is oriented to person, place, and time. She appears well-developed and well-nourished. No distress.  Left facial droop. Chronic from stroke three years ago, denies any changes.  HENT:  Head: Normocephalic and atraumatic.  Right Ear: Hearing, tympanic membrane, external ear and ear canal normal.  Left Ear: Hearing, tympanic membrane, external ear and ear canal normal.  Nose: Nose normal.  Mouth/Throat: Oropharynx is clear and moist. No oropharyngeal exudate.  Eyes: Conjunctivae and EOM are normal. Pupils are equal, round, and reactive to light.  Cardiovascular: Normal rate, regular rhythm, normal heart sounds and intact  distal pulses.   No murmur heard. Pulmonary/Chest: Effort normal and breath sounds normal. No respiratory distress. She has no wheezes. She has no rhonchi.  Lungs clear, normal effort, no distress.  Musculoskeletal: She exhibits tenderness.  Negative NEER, negative Hawkins in right shoulder. Right had dominant. Equal grip strength. Equal bicep, tricep strength. Some discomfort with terminal ROM. Full rotator cuff strength. On right hip TTP along trochanteric bursa. Pain with internal rotation of the right hip. Negative right straight leg raise. Right knee without effusion or redness. Tender along medial joint  line of right knee. Negative Mcmurray.  Lumbar exam: No focal tenderness. Full ROM. Pain into the hip with right rotation.  Neurological: She is alert and oriented to person, place, and time.  Skin: Skin is warm and dry. No rash noted.  Psychiatric: She has a normal mood and affect. Her behavior is normal.  Vitals reviewed.   [facility] reading (PRIMARY) by  Dr. Neva Seat.   Right shoulder: No apparent fracture or acute findings.   Right hip: No fracture or apparent acute findings.  Right knee: Medial degenerative joint disease with spurring, and a small possible flabella. No apparent fracture.   Filed Vitals:   09/10/14 1014  BP: 152/84  Pulse: 72  Temp: 97.9 F (36.6 C)  Resp: 16  Height:  (1.448 m)  Weight: 123 lb (55.792 kg)  SpO2: 97%       Assessment & Plan:   [name] is a 81 y.o. female Right hip pain - Plan: DG Hip Complete Right, acetaminophen (TYLENOL) 500 MG tablet,  Right shoulder pain - Plan: DG Shoulder Right, acetaminophen (TYLENOL) 500 MG tablet,  Right knee pain - Plan: acetaminophen (TYLENOL) 500 MG tablet, CANCELED: DG Knee Complete 4 Views Right  -NKI, suspected DJD flare. Initial trial of tylenol  Cough - Plan: azithromycin (ZITHROMAX) 250 MG tablet, guaiFENesin (MUCINEX) 600 MG 12 hr tablet  - LRTI/bronchitis s/p treatment with Augmentin few weeks ago. Clear on exam, and improving.   -mucinex Rx, sx care and if not continuing to improve in next week - can fill Zpak, but SED and back to back abx concerns discussed. rtc precautions.   Language barrier - husband translating. Understanding expressed of plan after reviewed in detail.   Meds ordered this encounter  Medications  . azithromycin (ZITHROMAX) 250 MG tablet    Sig: Take 2 pills by mouth on day 1, then 1 pill by mouth per day on days 2 through 5.    Dispense:  6 tablet    Refill:  0  . guaiFENesin (MUCINEX) 600 MG 12 hr tablet    Sig: Take 1 tablet (600 mg total) by mouth 2 (two) times  daily as needed.    Dispense:  30 tablet    Refill:  0  . acetaminophen (TYLENOL) 500 MG tablet    Sig: Take 1 tablet (500 mg total) by mouth every 6 (six) hours as needed.    Dispense:  30 tablet    Refill:  0   Patient Instructions  There is some arthritis on your knee xray, other xrays look ok. Start with over the counter tylenol (I prescribed this) for your pains. If this does not help, then occasional Advil or Alleve is ok if needed. If this is not improving in the next 2 weeks, or worsening sooner - return for recheck.  If any weakness - be seen here or emergency room right away.   For cough - can try mucinex every  12 hours to help produce phlegm.  No other medicine needed if cough is improving.  IF cough not improving in next week - can start different antibiotic (printed prescription).  If any shortness of breath or worsening - return here or emergency room.   Cough, Adult  A cough is a reflex that helps clear your throat and airways. It can help heal the body or may be a reaction to an irritated airway. A cough may only last 2 or 3 weeks (acute) or may last more than 8 weeks (chronic).  CAUSES Acute cough:  Viral or bacterial infections. Chronic cough:  Infections.  Allergies.  Asthma.  Post-nasal drip.  Smoking.  Heartburn or acid reflux.  Some medicines.  Chronic lung problems (COPD).  Cancer. SYMPTOMS   Cough.  Fever.  Chest pain.  Increased breathing rate.  High-pitched whistling sound when breathing (wheezing).  Colored mucus that you cough up (sputum). TREATMENT   A bacterial cough may be treated with antibiotic medicine.  A viral cough must run its course and will not respond to antibiotics.  Your caregiver may recommend other treatments if you have a chronic cough. HOME CARE INSTRUCTIONS   Only take over-the-counter or prescription medicines for pain, discomfort, or fever as directed by your caregiver. Use cough suppressants only as  directed by your caregiver.  Use a cold steam vaporizer or humidifier in your bedroom or home to help loosen secretions.  Sleep in a semi-upright position if your cough is worse at night.  Rest as needed.  Stop smoking if you smoke. SEEK IMMEDIATE MEDICAL CARE IF:   You have pus in your sputum.  Your cough starts to worsen.  You cannot control your cough with suppressants and are losing sleep.  You begin coughing up blood.  You have difficulty breathing.  You develop pain which is getting worse or is uncontrolled with medicine.  You have a fever. MAKE SURE YOU:   Understand these instructions.  Will watch your condition.  Will get help right away if you are not doing well or get worse. Document Released: 02/22/2011 Document Revised: 11/18/2011 Document Reviewed: 02/22/2011 [facility] Patient Information 2015 [place], [place]. This information is not intended to replace advice given to you by your health care provider. Make sure you discuss any questions you have with your health care provider.     I personally performed the services described in this documentation, which was scribed in my presence. The recorded information has been reviewed and considered, and addended by me as needed.

## 2014-09-10 NOTE — Patient Instructions (Addendum)
There is some arthritis on your knee xray, other xrays look ok. Start with over the counter tylenol (I prescribed this) for your pains. If this does not help, then occasional Advil or Alleve is ok if needed. If this is not improving in the next 2 weeks, or worsening sooner - return for recheck.  If any weakness - be seen here or emergency room right away.   For cough - can try mucinex every 12 hours to help produce phlegm.  No other medicine needed if cough is improving.  IF cough not improving in next week - can start different antibiotic (printed prescription).  If any shortness of breath or worsening - return here or emergency room.   Cough, Adult  A cough is a reflex that helps clear your throat and airways. It can help heal the body or may be a reaction to an irritated airway. A cough may only last 2 or 3 weeks (acute) or may last more than 8 weeks (chronic).  CAUSES Acute cough:  Viral or bacterial infections. Chronic cough:  Infections.  Allergies.  Asthma.  Post-nasal drip.  Smoking.  Heartburn or acid reflux.  Some medicines.  Chronic lung problems (COPD).  Cancer. SYMPTOMS   Cough.  Fever.  Chest pain.  Increased breathing rate.  High-pitched whistling sound when breathing (wheezing).  Colored mucus that you cough up (sputum). TREATMENT   A bacterial cough may be treated with antibiotic medicine.  A viral cough must run its course and will not respond to antibiotics.  Your caregiver may recommend other treatments if you have a chronic cough. HOME CARE INSTRUCTIONS   Only take over-the-counter or prescription medicines for pain, discomfort, or fever as directed by your caregiver. Use cough suppressants only as directed by your caregiver.  Use a cold steam vaporizer or humidifier in your bedroom or home to help loosen secretions.  Sleep in a semi-upright position if your cough is worse at night.  Rest as needed.  Stop smoking if you smoke. SEEK  IMMEDIATE MEDICAL CARE IF:   You have pus in your sputum.  Your cough starts to worsen.  You cannot control your cough with suppressants and are losing sleep.  You begin coughing up blood.  You have difficulty breathing.  You develop pain which is getting worse or is uncontrolled with medicine.  You have a fever. MAKE SURE YOU:   Understand these instructions.  Will watch your condition.  Will get help right away if you are not doing well or get worse. Document Released: 02/22/2011 Document Revised: 11/18/2011 Document Reviewed: 02/22/2011 [facility] Patient Information 2015 [place], [place]. This information is not intended to replace advice given to you by your health care provider. Make sure you discuss any questions you have with your health care provider.

## 2015-01-23 ENCOUNTER — Encounter (HOSPITAL_COMMUNITY): Payer: Self-pay | Admitting: Emergency Medicine

## 2015-01-23 ENCOUNTER — Emergency Department (INDEPENDENT_AMBULATORY_CARE_PROVIDER_SITE_OTHER)
Admission: EM | Admit: 2015-01-23 | Discharge: 2015-01-23 | Disposition: A | Discharge status: Home / Self Care | Payer: Medicare Other | Source: Home / Self Care | Attending: Family Medicine | Admitting: Family Medicine

## 2015-01-23 DIAGNOSIS — M25461 Effusion, right knee: Secondary | ICD-10-CM

## 2015-01-23 DIAGNOSIS — M25561 Pain in right knee: Secondary | ICD-10-CM | POA: Diagnosis not present

## 2015-01-23 LAB — SYNOVIAL CELL COUNT + DIFF, W/ CRYSTALS
Crystals, Fluid: NONE SEEN
EOSINOPHILS-SYNOVIAL: 0 % (ref 0–1)
LYMPHOCYTES-SYNOVIAL FLD: 25 % — AB (ref 0–20)
MONOCYTE-MACROPHAGE-SYNOVIAL FLUID: 71 % (ref 50–90)
NEUTROPHIL, SYNOVIAL: 4 % (ref 0–25)
WBC, Synovial: 707 /mm3 — ABNORMAL HIGH (ref 0–200)

## 2015-01-23 MED ORDER — METHYLPREDNISOLONE ACETATE 40 MG/ML IJ SUSP
INTRAMUSCULAR | Status: AC
  Filled 2015-01-23: qty 1

## 2015-01-23 NOTE — ED Provider Notes (Signed)
Ashley Blake [place]wandee is a 81 y.o. female who presents to Urgent Care today for Knee pain and swelling for the last 6 months worsening recently. She denies any injury. No radiating pain weakness or numbness. She's tried ice heat and some Tylenol which helps a little. Pain worse over the past few days.   Past Medical History  Diagnosis Date  . Arthritis   . Hypertension    History reviewed. No pertinent past surgical history. History  Substance Use Topics  . Smoking status: Never Smoker   . Smokeless tobacco: Not on file  . Alcohol Use: Not on file   ROS as above Medications: No current facility-administered medications for this encounter.   Current Outpatient Prescriptions  Medication Sig Dispense Refill  . acetaminophen (TYLENOL) 500 MG tablet Take 1 tablet (500 mg total) by mouth every 6 (six) hours as needed. 30 tablet 0  . amoxicillin-clavulanate (AUGMENTIN) 875-125 MG per tablet Take 1 tablet by mouth 2 (two) times daily. 20 tablet 0  . azithromycin (ZITHROMAX) 250 MG tablet Take 2 pills by mouth on day 1, then 1 pill by mouth per day on days 2 through 5. 6 tablet 0  . benazepril (LOTENSIN) 20 MG tablet Take 20 mg by mouth daily.    . carvedilol (COREG) 25 MG tablet Take 25 mg by mouth 2 (two) times daily with a meal.    . chlorpheniramine-HYDROcodone (TUSSIONEX PENNKINETIC ER) 10-8 MG/5ML LQCR Take 5 mLs by mouth every 12 (twelve) hours as needed. 60 mL 0  . guaiFENesin (MUCINEX) 600 MG 12 hr tablet Take 1 tablet (600 mg total) by mouth 2 (two) times daily as needed. 30 tablet 0  . rosuvastatin (CRESTOR) 10 MG tablet Take 10 mg by mouth daily.     No Known Allergies   Exam:  BP 172/86 mmHg  Pulse 78  Temp(Src) 97.3 F (36.3 C) (Oral)  Resp 20  SpO2 96% Gen: Well NAD HEENT: EOMI,  MMM Lungs: Normal work of breathing. CTABL Heart: RRR no MRG Abd: NABS, Soft. Nondistended, Nontender Exts: Brisk capillary refill, warm and well perfused.  Right knee effusion present nontender  normal motion stable ligamentous exam. Pulses capillary refill and sensation intact distally.  Limited musculoskeletal ultrasound:  Normal proximal patella and distal femur. Large effusion present in the suprapatellar space.   Ultrasound-guided aspiration and injection of the right knee effusion. Consent obtained and timeout performed. .superior patella space cleaned with alcohol) applied and 1 mL of lidocaine injected on a wheel at the planned aspiration site. Betadine applied and an 18-gauge needle was inserted into the superior patellar space effusion using ultrasound guidance. 40 mL of cloudy fluid were removed. The syringes were exchanged and 40 mg of Depo-Medrol 4 mL of lidocaine were injected into the knee effusion. Patient tolerated the procedure well   Review of x-ray from June 2016 shows right knee arthritis.   No results found for this or any previous visit (from the past 24 hour(s)). No results found.  Assessment and Plan: 81 y.o. female with right knee effusion and pain due to DJD. Ultrasound guided aspiration and injection as above. Fluid sent for culture cell count and differential as well as crystal analysis. Follow-up with PCP.  Discussed warning signs or symptoms. Please see discharge instructions. Patient expresses understanding.     Rodolph BongEvan S Kerryn Tennant, MD 01/23/15 2114

## 2015-01-23 NOTE — ED Notes (Signed)
Pt states that she has had right knee pain since December 2015 intermittently

## 2015-01-23 NOTE — Discharge Instructions (Signed)
Thank you for coming in today. Call or go to the ER if you develop a large red swollen joint with extreme pain or oozing puss.    Arthralgia Your caregiver has diagnosed you as suffering from an arthralgia. Arthralgia means there is pain in a joint. This can come from many reasons including:  Bruising the joint which causes soreness (inflammation) in the joint.  Wear and tear on the joints which occur as we grow older (osteoarthritis).  Overusing the joint.  Various forms of arthritis.  Infections of the joint. Regardless of the cause of pain in your joint, most of these different pains respond to anti-inflammatory drugs and rest. The exception to this is when a joint is infected, and these cases are treated with antibiotics, if it is a bacterial infection. HOME CARE INSTRUCTIONS   Rest the injured area for as long as directed by your caregiver. Then slowly start using the joint as directed by your caregiver and as the pain allows. Crutches as directed may be useful if the ankles, knees or hips are involved. If the knee was splinted or casted, continue use and care as directed. If an stretchy or elastic wrapping bandage has been applied today, it should be removed and re-applied every 3 to 4 hours. It should not be applied tightly, but firmly enough to keep swelling down. Watch toes and feet for swelling, bluish discoloration, coldness, numbness or excessive pain. If any of these problems (symptoms) occur, remove the ace bandage and re-apply more loosely. If these symptoms persist, contact your caregiver or return to this location.  For the first 24 hours, keep the injured extremity elevated on pillows while lying down.  Apply ice for 15-20 minutes to the sore joint every couple hours while awake for the first half day. Then 03-04 times per day for the first 48 hours. Put the ice in a plastic bag and place a towel between the bag of ice and your skin.  Wear any splinting, casting, elastic  bandage applications, or slings as instructed.  Only take over-the-counter or prescription medicines for pain, discomfort, or fever as directed by your caregiver. Do not use aspirin immediately after the injury unless instructed by your physician. Aspirin can cause increased bleeding and bruising of the tissues.  If you were given crutches, continue to use them as instructed and do not resume weight bearing on the sore joint until instructed. Persistent pain and inability to use the sore joint as directed for more than 2 to 3 days are warning signs indicating that you should see a caregiver for a follow-up visit as soon as possible. Initially, a hairline fracture (break in bone) may not be evident on X-rays. Persistent pain and swelling indicate that further evaluation, non-weight bearing or use of the joint (use of crutches or slings as instructed), or further X-rays are indicated. X-rays may sometimes not show a small fracture until a week or 10 days later. Make a follow-up appointment with your own caregiver or one to whom we have referred you. A radiologist (specialist in reading X-rays) may read your X-rays. Make sure you know how you are to obtain your X-ray results. Do not assume everything is normal if you do not hear from us. SEEK MEDICAL CARE IF: Bruising, swelling, or pain increases. SEEK IMMEDIATE MEDICAL CARE IF:   Your fingers or toes are numb or blue.  The pain is not responding to medications and continues to stay the same or get worse.  The pain  in your joint becomes severe.  You develop a fever over 102 F (38.9 C).  It becomes impossible to move or use the joint. MAKE SURE YOU:   Understand these instructions.  Will watch your condition.  Will get help right away if you are not doing well or get worse. Document Released: 08/26/2005 Document Revised: 11/18/2011 Document Reviewed: 04/13/2008 [facility] Patient Information 2015 [place], [place]. This information is not  intended to replace advice given to you by your health care provider. Make sure you discuss any questions you have with your health care provider.

## 2015-01-24 LAB — PATHOLOGIST SMEAR REVIEW

## 2015-01-27 LAB — BODY FLUID CULTURE
Culture: NO GROWTH
SPECIAL REQUESTS: NORMAL

## 2015-01-27 NOTE — ED Notes (Signed)
Synovial fluid cell count reviewed by Dr. Denyse Amassorey on 5/18.  Fluid culture knee: No growth x 3 days.  No further action needed. Ashley Blake, Ashley Blake 01/27/2015

## 2015-05-12 ENCOUNTER — Encounter (HOSPITAL_COMMUNITY): Payer: Self-pay | Admitting: Emergency Medicine

## 2015-05-12 ENCOUNTER — Emergency Department (INDEPENDENT_AMBULATORY_CARE_PROVIDER_SITE_OTHER)
Admission: EM | Admit: 2015-05-12 | Discharge: 2015-05-12 | Disposition: A | Discharge status: Home / Self Care | Payer: Medicare Other | Source: Home / Self Care | Attending: Emergency Medicine | Admitting: Emergency Medicine

## 2015-05-12 DIAGNOSIS — M25461 Effusion, right knee: Secondary | ICD-10-CM | POA: Diagnosis not present

## 2015-05-12 DIAGNOSIS — M13861 Other specified arthritis, right knee: Secondary | ICD-10-CM | POA: Diagnosis not present

## 2015-05-12 DIAGNOSIS — M1990 Unspecified osteoarthritis, unspecified site: Secondary | ICD-10-CM | POA: Diagnosis not present

## 2015-05-12 DIAGNOSIS — I10 Essential (primary) hypertension: Secondary | ICD-10-CM | POA: Diagnosis not present

## 2015-05-12 MED ORDER — LIDOCAINE HCL (PF) 2 % IJ SOLN
INTRAMUSCULAR | Status: AC
  Filled 2015-05-12: qty 2

## 2015-05-12 MED ORDER — METHYLPREDNISOLONE ACETATE 40 MG/ML IJ SUSP
INTRAMUSCULAR | Status: AC
  Filled 2015-05-12: qty 1

## 2015-05-12 NOTE — ED Notes (Signed)
Via daughter, [place] interpreter...  C/o right knee pain and swelling onset 7 days  Seen here back in may and rec'd a cortisone inj and had fluid drained Steady gait... Pain increases w/activity Alert... No acute distress.

## 2015-05-12 NOTE — ED Provider Notes (Signed)
CSN: [id number]     Arrival date & time 05/12/15  1711 History   First MD Initiated Contact with Patient 05/12/15 1746     Chief Complaint  Patient presents with  . Knee Pain   (Consider location/radiation/quality/duration/timing/severity/associated sxs/prior Treatment) HPI  She is an 81 year old woman here with her daughter for evaluation of right knee pain. Her daughter acts as Equities trader. She states she has arthritis in her knees, and the right knee is flared up over the last week or so. She states it is aching and full feeling. She states this happened several months before and she was seen here. At that time she had her knee drained and a cortisone injection done. She states this greatly improved her pain.  Past Medical History  Diagnosis Date  . Arthritis   . Hypertension    History reviewed. No pertinent past surgical history. No family history on file. Social History  Substance Use Topics  . Smoking status: Never Smoker   . Smokeless tobacco: None  . Alcohol Use: None   OB History    No data available     Review of Systems As in history of present illness Allergies  Review of patient's allergies indicates no known allergies.  Home Medications   Prior to Admission medications   Medication Sig Start Date End Date Taking? Authorizing Provider  alendronate (FOSAMAX) 70 MG tablet Take 70 mg by mouth once a week. Take with a full glass of water on an empty stomach.   Yes Historical Provider, MD  alprazolam Prudy Feeler) 2 MG tablet Take 2 mg by mouth at bedtime as needed for sleep.   Yes Historical Provider, MD  benazepril (LOTENSIN) 20 MG tablet Take 20 mg by mouth daily.   Yes Historical Provider, MD  Calcium-Phosphorus-Vitamin D (RISACAL-D PO) Take by mouth.   Yes Historical Provider, MD  carvedilol (COREG) 25 MG tablet Take 25 mg by mouth 2 (two) times daily with a meal.   Yes Historical Provider, MD  rosuvastatin (CRESTOR) 10 MG tablet Take 10 mg by mouth daily.   Yes  Historical Provider, MD  acetaminophen (TYLENOL) 500 MG tablet Take 1 tablet (500 mg total) by mouth every 6 (six) hours as needed. 09/10/14   Shade Flood, MD  amoxicillin-clavulanate (AUGMENTIN) 875-125 MG per tablet Take 1 tablet by mouth 2 (two) times daily. 08/24/14   Carmelina Dane, MD  azithromycin (ZITHROMAX) 250 MG tablet Take 2 pills by mouth on day 1, then 1 pill by mouth per day on days 2 through 5. 09/10/14   Shade Flood, MD  chlorpheniramine-HYDROcodone [facility] PENNKINETIC ER) 10-8 MG/5ML [facility] Take 5 mLs by mouth every 12 (twelve) hours as needed. 08/24/14   Carmelina Dane, MD  guaiFENesin (MUCINEX) 600 MG 12 hr tablet Take 1 tablet (600 mg total) by mouth 2 (two) times daily as needed. 09/10/14   Shade Flood, MD   Meds Ordered and Administered this Visit  Medications - No data to display  There were no vitals taken for this visit. No data found.   Physical Exam  Constitutional: She is oriented to person, place, and time. She appears well-developed. No distress.  Cardiovascular: Normal rate.   Pulmonary/Chest: Effort normal.  Musculoskeletal:  Right knee: No erythema or warmth. She does have a moderate joint effusion. No specific point tenderness.  Neurological: She is alert and oriented to person, place, and time.    ED Course  Injection of joint Date/Time: 05/12/2015 6:29 PM Performed  by: Charm Rings Authorized by: Charm Rings Consent: Verbal consent obtained. Risks and benefits: risks, benefits and alternatives were discussed Consent given by: patient Patient understanding: patient states understanding of the procedure being performed Patient identity confirmed: verbally with patient Time out: Immediately prior to procedure a "time out" was called to verify the correct patient, procedure, equipment, support staff and site/side marked as required. Local anesthesia used: yes Anesthesia: local infiltration Local anesthetic: lidocaine 2% without  epinephrine Anesthetic total: 1 ml Patient tolerance: Patient tolerated the procedure well with no immediate complications Comments: Skin was prepped with alcohol and Betadine. A needle was inserted into the suprapatellar space and approximately 30 mL of clear straw-colored fluid was removed. 40 mg of Depo-Medrol with 4 mL of 2% lidocaine was injected into the joint. Patient tolerated procedure well with no immediate complication.   (including critical care time)  Labs Review Labs Reviewed - No data to display  Imaging Review No results found.   MDM   1. Knee effusion, right   2. Arthritis    Joint aspirated and injected with Depo-Medrol. Knee sleeve given. She will follow-up with her orthopedic doctor in 3 weeks.    Charm Rings, MD 05/12/15 [phone]

## 2015-05-12 NOTE — Discharge Instructions (Signed)
We removed some fluid from your knee today. We also did a cortisone injection. Please ice the knee tonight and tomorrow. Wear the knee sleeve for the next 3 days. Follow-up with your orthopedic doctor as scheduled in 3 weeks.

## 2015-05-23 DIAGNOSIS — M25461 Effusion, right knee: Secondary | ICD-10-CM | POA: Diagnosis not present

## 2015-05-23 DIAGNOSIS — M25561 Pain in right knee: Secondary | ICD-10-CM | POA: Diagnosis not present

## 2015-05-23 DIAGNOSIS — M2391 Unspecified internal derangement of right knee: Secondary | ICD-10-CM | POA: Diagnosis not present

## 2015-05-26 ENCOUNTER — Other Ambulatory Visit: Payer: Self-pay | Admitting: Sports Medicine

## 2015-05-26 DIAGNOSIS — M25461 Effusion, right knee: Secondary | ICD-10-CM

## 2015-06-09 ENCOUNTER — Ambulatory Visit
Admission: RE | Admit: 2015-06-09 | Discharge: 2015-06-09 | Disposition: A | Discharge status: Home / Self Care | Payer: Medicaid Other | Source: Ambulatory Visit | Attending: Sports Medicine | Admitting: Sports Medicine

## 2015-06-09 DIAGNOSIS — M25461 Effusion, right knee: Secondary | ICD-10-CM

## 2015-06-09 DIAGNOSIS — S83241A Other tear of medial meniscus, current injury, right knee, initial encounter: Secondary | ICD-10-CM | POA: Diagnosis not present

## 2015-06-10 ENCOUNTER — Other Ambulatory Visit: Payer: Medicare Other

## 2015-07-07 DIAGNOSIS — S83241D Other tear of medial meniscus, current injury, right knee, subsequent encounter: Secondary | ICD-10-CM | POA: Diagnosis not present

## 2015-07-07 DIAGNOSIS — S83281D Other tear of lateral meniscus, current injury, right knee, subsequent encounter: Secondary | ICD-10-CM | POA: Diagnosis not present

## 2015-07-07 DIAGNOSIS — M1711 Unilateral primary osteoarthritis, right knee: Secondary | ICD-10-CM | POA: Diagnosis not present

## 2015-07-19 DIAGNOSIS — I517 Cardiomegaly: Secondary | ICD-10-CM | POA: Diagnosis not present

## 2015-07-19 DIAGNOSIS — Z01818 Encounter for other preprocedural examination: Secondary | ICD-10-CM | POA: Diagnosis not present

## 2015-07-19 DIAGNOSIS — S83281D Other tear of lateral meniscus, current injury, right knee, subsequent encounter: Secondary | ICD-10-CM | POA: Diagnosis not present

## 2015-07-28 DIAGNOSIS — M67361 Transient synovitis, right knee: Secondary | ICD-10-CM | POA: Diagnosis not present

## 2015-07-28 DIAGNOSIS — S83241D Other tear of medial meniscus, current injury, right knee, subsequent encounter: Secondary | ICD-10-CM | POA: Diagnosis not present

## 2015-07-28 DIAGNOSIS — M2241 Chondromalacia patellae, right knee: Secondary | ICD-10-CM | POA: Diagnosis not present

## 2015-07-28 DIAGNOSIS — M23321 Other meniscus derangements, posterior horn of medial meniscus, right knee: Secondary | ICD-10-CM | POA: Diagnosis not present

## 2015-07-28 DIAGNOSIS — M65861 Other synovitis and tenosynovitis, right lower leg: Secondary | ICD-10-CM | POA: Diagnosis not present

## 2015-07-28 DIAGNOSIS — M23221 Derangement of posterior horn of medial meniscus due to old tear or injury, right knee: Secondary | ICD-10-CM | POA: Diagnosis not present

## 2015-07-28 DIAGNOSIS — M94261 Chondromalacia, right knee: Secondary | ICD-10-CM | POA: Diagnosis not present

## 2015-07-28 DIAGNOSIS — M23200 Derangement of unspecified lateral meniscus due to old tear or injury, right knee: Secondary | ICD-10-CM | POA: Diagnosis not present

## 2016-02-25 IMAGING — MR MR KNEE*R* W/O CM
4 of 5 series · 13 of 40 positions shown · non-contrast
Comparison: None.

CLINICAL DATA: Right knee pain.  Mostly anterior for 9 months.

EXAM:
MRI OF THE RIGHT KNEE WITHOUT CONTRAST
TECHNIQUE: Multiplanar, multisequence MR imaging of the knee was performed. No
intravenous contrast was administered.

[Series 3: PD fat-sat · axial · 3.5mm · 0.31mm/px · z∈[-27,+42]mm · 4 of 23 slices shown (1 of 3)]
[im 1/23]
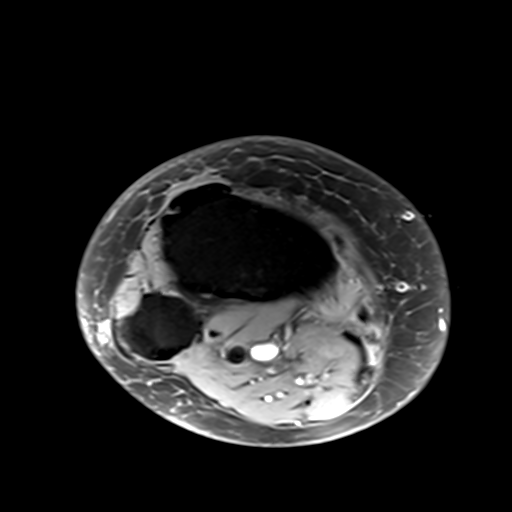
[im 4/23]
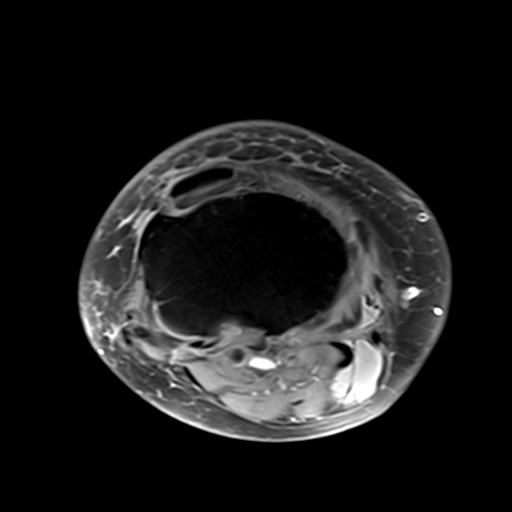
[im 13/23]
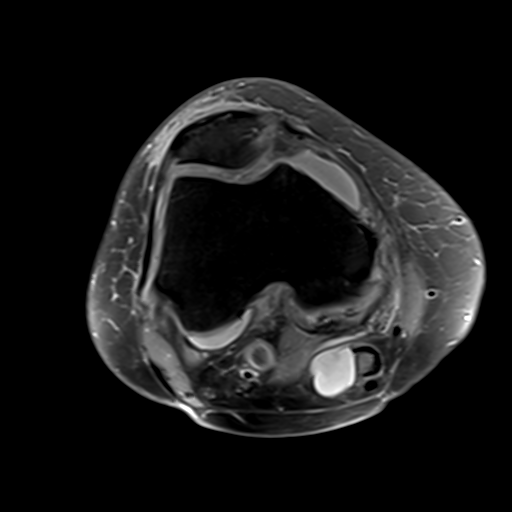
[im 19/23]
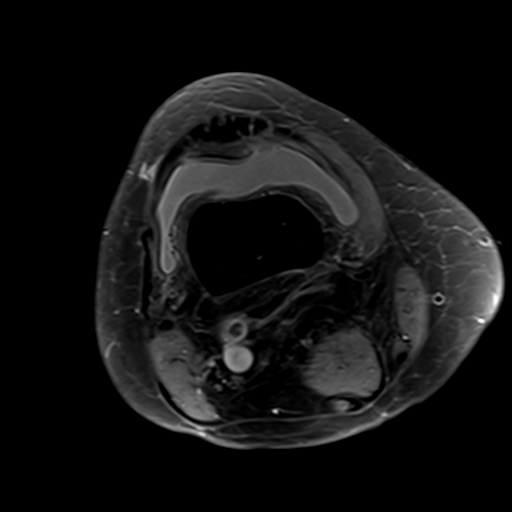

[Series 4: PD fat-sat · coronal · 3.5mm · 0.44mm/px · 3 of 22 slices shown (2 of 3)]
[im 4/22]
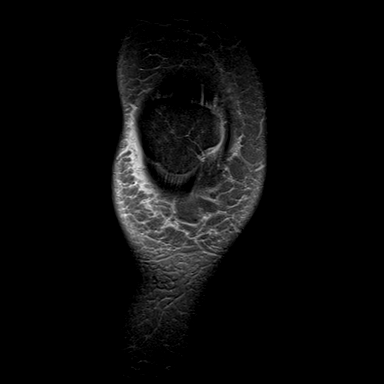
[im 13/22]
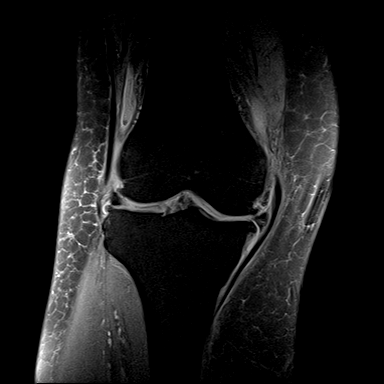
[im 19/22]
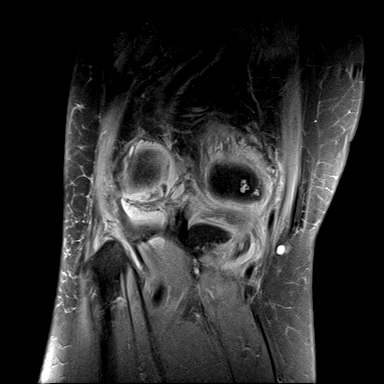

[Series 5: T2 fat-sat · coronal · 3.5mm · 0.22mm/px · 3 of 22 slices shown]
[im 4/22]
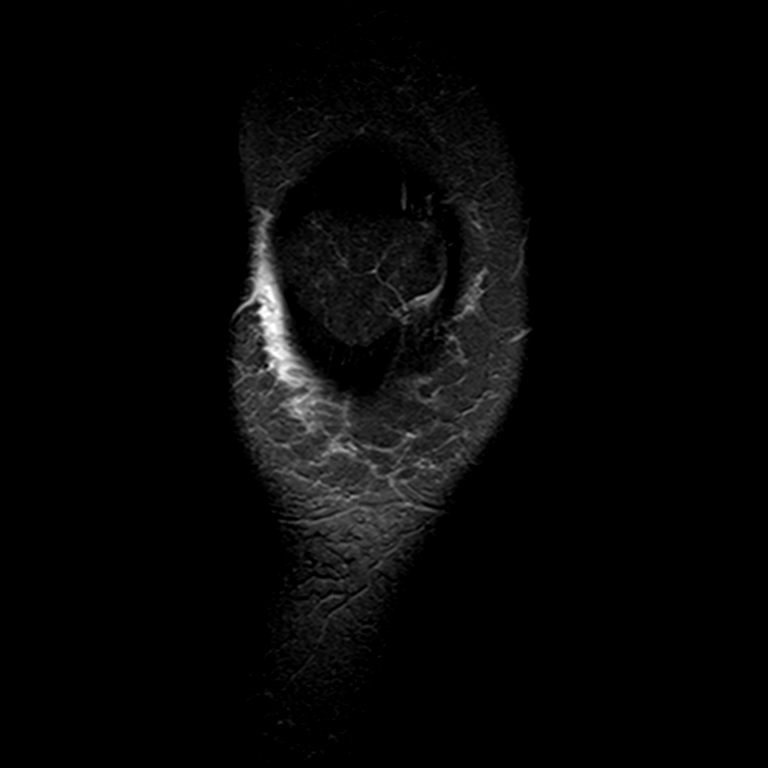
[im 13/22]
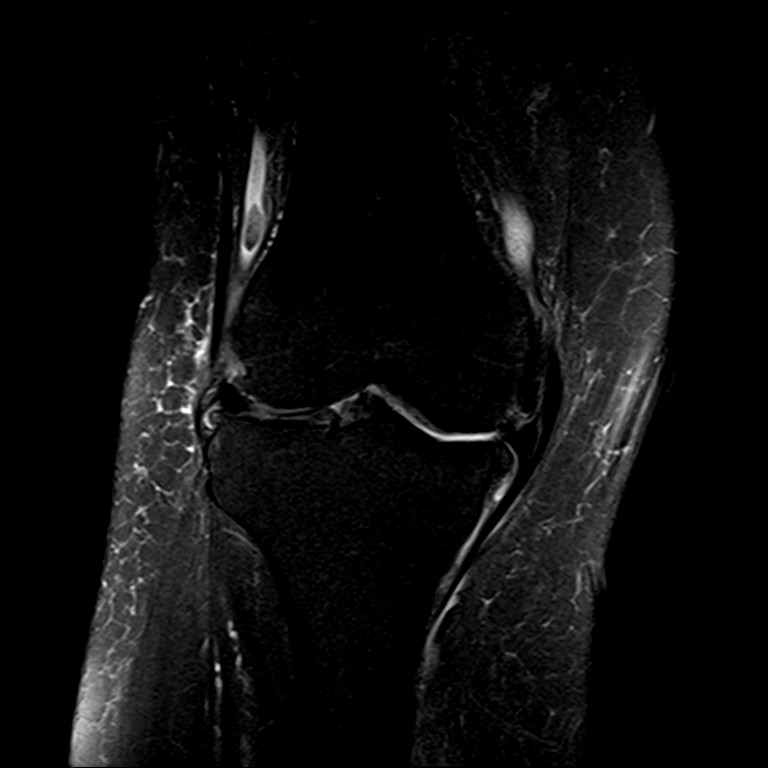
[im 19/22]
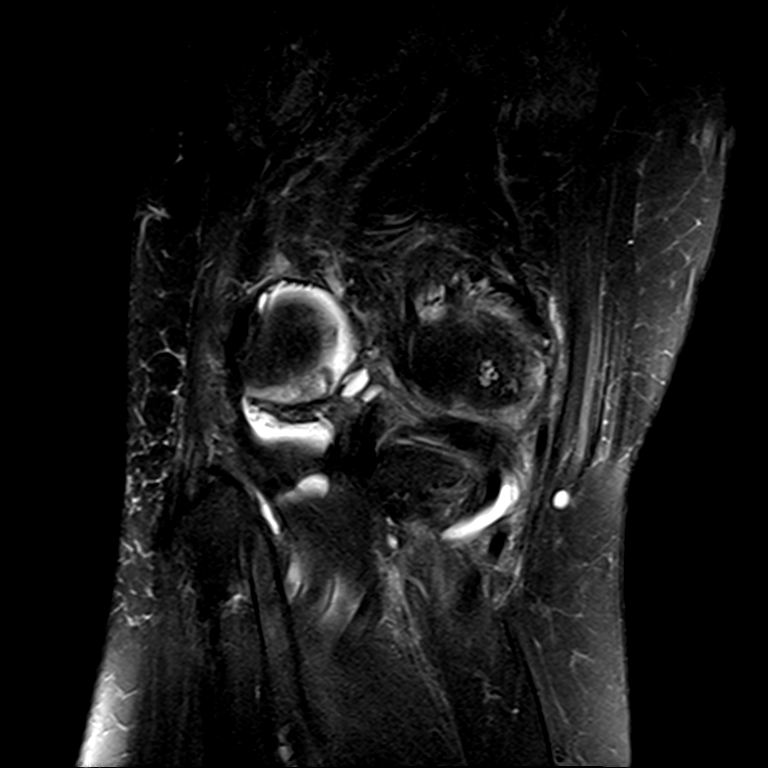

[Series 7: PD fat-sat · sagittal · 3.5mm · 0.21mm/px · 3 of 24 slices shown (3 of 3)]
[im 4/24]
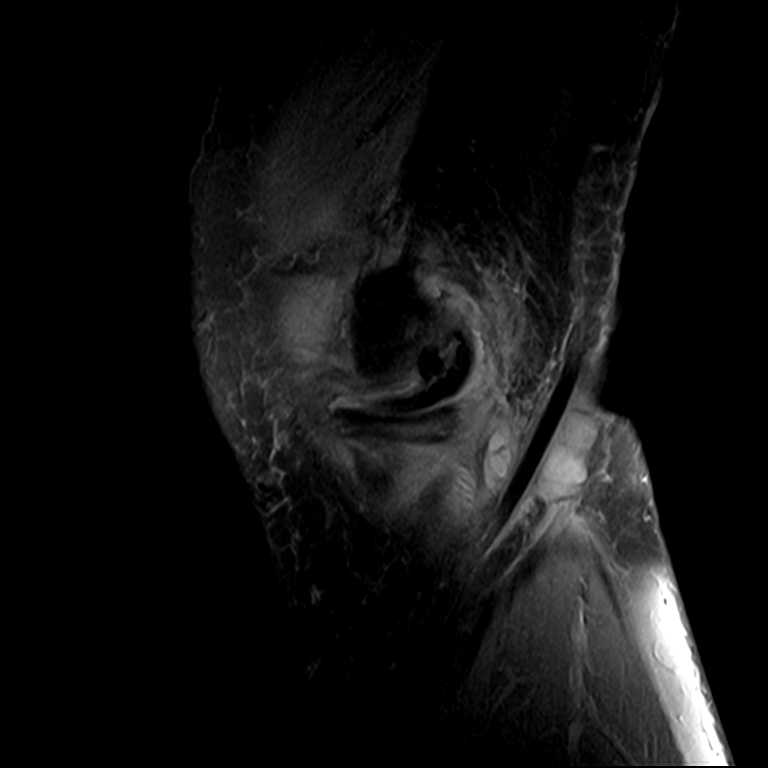
[im 14/24]
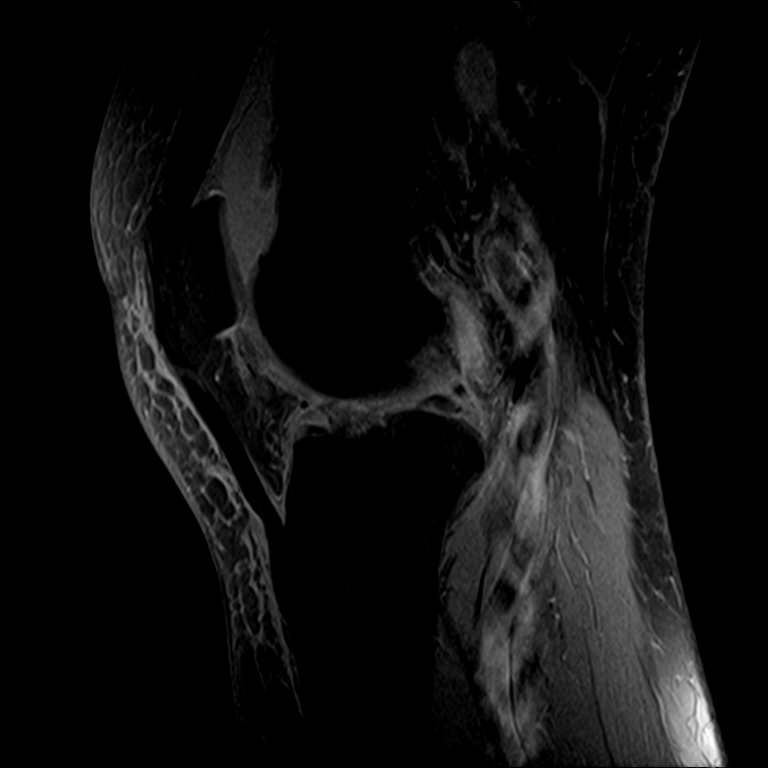
[im 20/24]
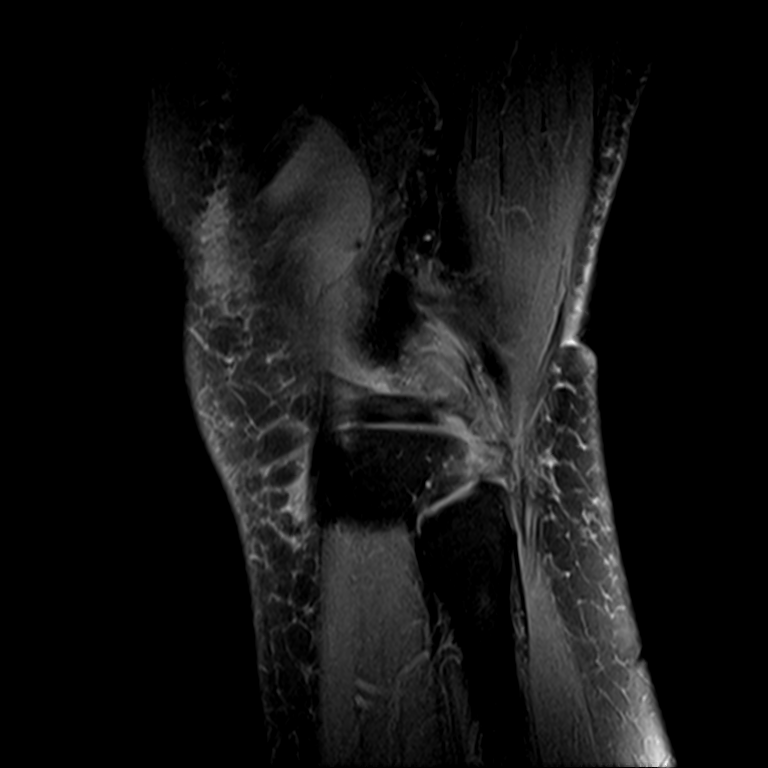

[13 of 40 positions shown; findings below may reference images not displayed]

FINDINGS: MENISCI

Medial meniscus: Radial tear of the posterior horn of the medial
meniscus with peripheral meniscal extrusion.

Lateral meniscus: Vertical tear of the anterior horn of the lateral
meniscus. Small radial tear of the free edge of the body of the
lateral meniscus.

LIGAMENTS

Cruciates:  Intact ACL and PCL.

Collaterals: Medial collateral ligament is intact. Lateral
collateral ligament complex is intact.

CARTILAGE

Patellofemoral:  Mild chondromalacia.

Medial: High-grade partial-thickness cartilage loss with areas of
full-thickness cartilage loss of the medial femoral condyle and
medial tibial plateau.

Lateral: Cartilage irregularity with partial thickness cartilage
loss of the lateral femoral condyle and lateral tibial plateau.

Joint: Large joint effusion. Mild edema in Hoffa's fat. No plical
thickening.

Popliteal Fossa: Moderate-sized Baker cyst. Mild tendinosis of the
femoral insertion of the popliteus tendon. Severe tendinosis of the
medial gastrocnemius origin.

Extensor Mechanism:  Intact.

Bones: Tricompartmental marginal osteophytes. No other marrow signal
abnormality. No fracture or dislocation.
IMPRESSION: 1. Radial tear of the posterior horn of the medial meniscus with
peripheral meniscal extrusion.
2. Vertical tear of the anterior horn of the lateral meniscus. Small
radial tear of the free edge of the body of the lateral meniscus.
3. Tricompartmental cartilage abnormalities most severe in the
medial and lateral femorotibial compartments.
4. Large joint effusion.  Moderate Baker's cyst.
